# Patient Record
Sex: Female | Born: 1955 | Race: Black or African American | Hispanic: No | Smoking: Current some day smoker
Health system: Southern US, Community
[De-identification: ages and names within clinical notes are randomized; demographics above are authoritative.]

## PROBLEM LIST (undated history)

## (undated) DIAGNOSIS — E119 Type 2 diabetes mellitus without complications: Secondary | ICD-10-CM

## (undated) DIAGNOSIS — I10 Essential (primary) hypertension: Secondary | ICD-10-CM

---

## 1997-08-03 ENCOUNTER — Encounter: Admission: RE | Admit: 1997-08-03 | Discharge: 1997-08-03 | Payer: Self-pay | Admitting: Family Medicine

## 1997-08-14 ENCOUNTER — Encounter: Admission: RE | Admit: 1997-08-14 | Discharge: 1997-08-14 | Payer: Self-pay | Admitting: Family Medicine

## 1997-08-28 ENCOUNTER — Encounter: Admission: RE | Admit: 1997-08-28 | Discharge: 1997-08-28 | Payer: Self-pay | Admitting: Family Medicine

## 1997-09-20 ENCOUNTER — Encounter: Admission: RE | Admit: 1997-09-20 | Discharge: 1997-09-20 | Payer: Self-pay | Admitting: Family Medicine

## 1997-10-20 ENCOUNTER — Encounter: Admission: RE | Admit: 1997-10-20 | Discharge: 1997-10-20 | Payer: Self-pay | Admitting: Family Medicine

## 1997-11-02 ENCOUNTER — Other Ambulatory Visit: Admission: RE | Admit: 1997-11-02 | Discharge: 1997-11-02 | Payer: Self-pay | Admitting: *Deleted

## 1997-12-23 ENCOUNTER — Emergency Department (HOSPITAL_COMMUNITY): Admission: EM | Admit: 1997-12-23 | Discharge: 1997-12-23 | Payer: Self-pay | Admitting: Emergency Medicine

## 1998-01-24 ENCOUNTER — Encounter: Admission: RE | Admit: 1998-01-24 | Discharge: 1998-01-24 | Payer: Self-pay | Admitting: Family Medicine

## 1998-03-01 ENCOUNTER — Encounter: Admission: RE | Admit: 1998-03-01 | Discharge: 1998-03-01 | Payer: Self-pay | Admitting: Family Medicine

## 1998-03-27 ENCOUNTER — Encounter: Admission: RE | Admit: 1998-03-27 | Discharge: 1998-03-27 | Payer: Self-pay | Admitting: Family Medicine

## 1998-08-27 ENCOUNTER — Encounter: Admission: RE | Admit: 1998-08-27 | Discharge: 1998-08-27 | Payer: Self-pay | Admitting: Family Medicine

## 1998-09-05 ENCOUNTER — Encounter: Admission: RE | Admit: 1998-09-05 | Discharge: 1998-09-05 | Payer: Self-pay | Admitting: Family Medicine

## 1998-09-20 ENCOUNTER — Encounter: Admission: RE | Admit: 1998-09-20 | Discharge: 1998-09-20 | Payer: Self-pay | Admitting: Family Medicine

## 1998-12-03 ENCOUNTER — Encounter: Admission: RE | Admit: 1998-12-03 | Discharge: 1998-12-03 | Payer: Self-pay | Admitting: Family Medicine

## 2005-07-28 ENCOUNTER — Ambulatory Visit: Payer: Self-pay | Admitting: Internal Medicine

## 2008-11-23 ENCOUNTER — Emergency Department (HOSPITAL_COMMUNITY): Admission: EM | Admit: 2008-11-23 | Discharge: 2008-11-23 | Payer: Self-pay | Admitting: Emergency Medicine

## 2008-12-19 ENCOUNTER — Emergency Department (HOSPITAL_COMMUNITY): Admission: EM | Admit: 2008-12-19 | Discharge: 2008-12-19 | Payer: Self-pay | Admitting: Family Medicine

## 2009-02-07 ENCOUNTER — Emergency Department (HOSPITAL_COMMUNITY): Admission: EM | Admit: 2009-02-07 | Discharge: 2009-02-07 | Payer: Self-pay | Admitting: Family Medicine

## 2011-03-25 ENCOUNTER — Ambulatory Visit (HOSPITAL_COMMUNITY): Admit: 2011-03-25 | Payer: Self-pay | Admitting: Cardiology

## 2011-03-25 ENCOUNTER — Encounter (HOSPITAL_COMMUNITY): Payer: Self-pay

## 2011-03-25 SURGERY — LEFT HEART CATHETERIZATION WITH CORONARY ANGIOGRAM
Anesthesia: LOCAL

## 2016-11-29 ENCOUNTER — Emergency Department (HOSPITAL_COMMUNITY)
Admission: EM | Admit: 2016-11-29 | Discharge: 2016-11-29 | Disposition: A | Discharge status: Home / Self Care | Payer: Medicaid - Out of State | Attending: Emergency Medicine | Admitting: Emergency Medicine

## 2016-11-29 ENCOUNTER — Emergency Department (HOSPITAL_COMMUNITY): Payer: Medicaid - Out of State

## 2016-11-29 ENCOUNTER — Encounter (HOSPITAL_COMMUNITY): Payer: Self-pay | Admitting: Emergency Medicine

## 2016-11-29 DIAGNOSIS — M791 Myalgia: Secondary | ICD-10-CM | POA: Insufficient documentation

## 2016-11-29 DIAGNOSIS — Z79899 Other long term (current) drug therapy: Secondary | ICD-10-CM | POA: Diagnosis not present

## 2016-11-29 DIAGNOSIS — M79601 Pain in right arm: Secondary | ICD-10-CM | POA: Insufficient documentation

## 2016-11-29 DIAGNOSIS — F17210 Nicotine dependence, cigarettes, uncomplicated: Secondary | ICD-10-CM | POA: Insufficient documentation

## 2016-11-29 DIAGNOSIS — E119 Type 2 diabetes mellitus without complications: Secondary | ICD-10-CM | POA: Diagnosis not present

## 2016-11-29 DIAGNOSIS — Z7982 Long term (current) use of aspirin: Secondary | ICD-10-CM | POA: Diagnosis not present

## 2016-11-29 DIAGNOSIS — I10 Essential (primary) hypertension: Secondary | ICD-10-CM | POA: Insufficient documentation

## 2016-11-29 DIAGNOSIS — R0789 Other chest pain: Secondary | ICD-10-CM | POA: Diagnosis not present

## 2016-11-29 DIAGNOSIS — Z7984 Long term (current) use of oral hypoglycemic drugs: Secondary | ICD-10-CM | POA: Diagnosis not present

## 2016-11-29 DIAGNOSIS — M542 Cervicalgia: Secondary | ICD-10-CM | POA: Diagnosis not present

## 2016-11-29 HISTORY — DX: Essential (primary) hypertension: I10

## 2016-11-29 HISTORY — DX: Type 2 diabetes mellitus without complications: E11.9

## 2016-11-29 LAB — CBC WITH DIFFERENTIAL/PLATELET
Basophils Absolute: 0 10*3/uL (ref 0.0–0.1)
Basophils Relative: 0 %
Eosinophils Absolute: 0.2 10*3/uL (ref 0.0–0.7)
Eosinophils Relative: 3 %
HCT: 36.4 % (ref 36.0–46.0)
Hemoglobin: 12.3 g/dL (ref 12.0–15.0)
Lymphocytes Relative: 38 %
Lymphs Abs: 2 10*3/uL (ref 0.7–4.0)
MCH: 31.1 pg (ref 26.0–34.0)
MCHC: 33.8 g/dL (ref 30.0–36.0)
MCV: 92.2 fL (ref 78.0–100.0)
Monocytes Absolute: 0.4 10*3/uL (ref 0.1–1.0)
Monocytes Relative: 8 %
Neutro Abs: 2.6 10*3/uL (ref 1.7–7.7)
Neutrophils Relative %: 49 %
Platelets: 317 10*3/uL (ref 150–400)
RBC: 3.95 MIL/uL (ref 3.87–5.11)
RDW: 14.4 % (ref 11.5–15.5)
WBC: 5.3 10*3/uL (ref 4.0–10.5)

## 2016-11-29 LAB — BASIC METABOLIC PANEL
Anion gap: 7 (ref 5–15)
BUN: 11 mg/dL (ref 6–20)
CO2: 28 mmol/L (ref 22–32)
Calcium: 9 mg/dL (ref 8.9–10.3)
Chloride: 106 mmol/L (ref 101–111)
Creatinine, Ser: 0.66 mg/dL (ref 0.44–1.00)
GFR calc Af Amer: >60 mL/min (ref >60)
GFR calc non Af Amer: >60 mL/min (ref >60)
Glucose, Bld: 106 mg/dL — ABNORMAL HIGH (ref 65–99)
Potassium: 4.9 mmol/L (ref 3.5–5.1)
Sodium: 141 mmol/L (ref 135–145)

## 2016-11-29 LAB — I-STAT TROPONIN, ED: Troponin i, poc: 0 ng/mL (ref 0.00–0.08)

## 2016-11-29 MED ORDER — KETOROLAC TROMETHAMINE 15 MG/ML IJ SOLN
15.0000 mg | Freq: Once | INTRAMUSCULAR | Status: AC
  Administered 2016-11-29: 15 mg via INTRAVENOUS
  Filled 2016-11-29: qty 1

## 2016-11-29 MED ORDER — NAPROXEN 375 MG PO TABS: 375.0000 mg | ORAL_TABLET | Freq: Two times a day (BID) | ORAL | 0 refills | Status: AC

## 2016-11-29 MED ORDER — CYCLOBENZAPRINE HCL 10 MG PO TABS: 10.0000 mg | ORAL_TABLET | Freq: Two times a day (BID) | ORAL | 0 refills | Status: AC | PRN

## 2016-11-29 MED ORDER — ACETAMINOPHEN 500 MG PO TABS: 500.0000 mg | ORAL_TABLET | Freq: Four times a day (QID) | ORAL | 0 refills | Status: AC | PRN

## 2016-11-29 NOTE — ED Notes (Signed)
EKG performed in triage. [employer]

## 2016-11-29 NOTE — Discharge Instructions (Signed)
Medications: Naprosyn, Tylenol, Flexeril  Treatment: Take Naprosyn twice daily as prescribed for your pain. Take Tylenol 6 hours alternating with Naprosyn. Take Flexeril twice daily as needed for muscle pain and spasms. Use ice to your chest and neck 3-4 times daily alternating 15 minutes on, 15 minutes off.  Follow-up: Please follow-up with your doctor upon returning to [facility] your symptoms are not improved. Please return to the emergency department if you develop any new or worsening symptoms.

## 2016-11-29 NOTE — ED Triage Notes (Signed)
Patient here from home with complaints of chest pain radiating up into neck that started 2 days. Chest pain described as shooting  Denies n/v/d.

## 2016-11-29 NOTE — ED Provider Notes (Signed)
[facility]-EMERGENCY DEPT Provider Note   CSN: [id number] Arrival date & time: 11/29/16  1113     History   Chief Complaint Chief Complaint  Patient presents with  . Neck Pain  . Chest Pain    HPI Lori Burgess is a 61 y.o. female with history of hypertension, diabetes who presents with a 2 day history of right-sided chest pain. Her pain is sharp and constant. Patient reports her pain radiates to her neck and right arm. It is worse with movements and certain positions. Patient denies any injury. Patient does note that she occasionally carries her purse on her right shoulder. She denies shortness of breath, abdominal pain, nausea, vomiting, urinary symptoms. She has not taken any medications for her symptoms. She denies any recent long trips, surgeries, cancer, history of blood clots, new leg pain or swelling, exogenous estrogen use. Patient lives in [place] and is visiting her family member. She has been here for 2 weeks and plans to return home next week.  HPI  Past Medical History:  Diagnosis Date  . Diabetes mellitus without complication (HCC)   . Hypertension     There are no active problems to display for this patient.   No past surgical history on file.  OB History    No data available       Home Medications    Prior to Admission medications   Medication Sig Start Date End Date Taking? Authorizing Provider  aspirin 325 MG EC tablet Take 325 mg by mouth daily.   Yes [provider]  atorvastatin (LIPITOR) 40 MG tablet Take 40 mg by mouth at bedtime. 02/18/16  Yes [provider]  butalbital-acetaminophen-caffeine (FIORICET, ESGIC) 50-325-40 MG tablet Take 1 tablet by mouth 4 (four) times daily. 11/11/16  Yes [provider]  citalopram (CELEXA) 20 MG tablet Take 20 mg by mouth daily. 11/11/16  Yes [provider]  gabapentin (NEURONTIN) 300 MG capsule Take 300-600 mg by mouth 2 (two) times daily.   Yes [provider]  JANUVIA 50 MG tablet Take 50 mg by mouth daily. 11/11/16  Yes [provider]  lactulose (CHRONULAC) 10 GM/15ML solution Take 10 mLs by mouth daily. 11/11/16  Yes [provider]  losartan (COZAAR) 50 MG tablet Take 50 mg by mouth daily. 10/14/16  Yes [provider]  montelukast (SINGULAIR) 10 MG tablet Take 10 mg by mouth at bedtime. 10/14/16  Yes [provider]  acetaminophen (TYLENOL) 500 MG tablet Take 1 tablet (500 mg total) by mouth every 6 (six) hours as needed. 11/29/16   Clova Morlock, Waylan Boga, PA-C  cyclobenzaprine (FLEXERIL) 10 MG tablet Take 1 tablet (10 mg total) by mouth 2 (two) times daily as needed for muscle spasms. 11/29/16   Carma Dwiggins, Waylan Boga, PA-C  naproxen (NAPROSYN) 375 MG tablet Take 1 tablet (375 mg total) by mouth 2 (two) times daily. 11/29/16   Emi Holes, PA-C    Family History No family history on file.  Social History Social History  Substance Use Topics  . Smoking status: Current Some Day Smoker  . Smokeless tobacco: Never Used  . Alcohol use Not on file     Allergies   Patient has no known allergies.   Review of Systems Review of Systems  Constitutional: Negative for chills and fever.  HENT: Negative for facial swelling and sore throat.   Respiratory: Negative for shortness of breath.   Cardiovascular: Positive for chest pain.  Gastrointestinal: Negative for abdominal pain,  nausea and vomiting.  Genitourinary: Negative for dysuria.  Musculoskeletal: Positive for myalgias and neck pain. Negative for back pain.  Skin: Negative for rash and wound.  Neurological: Negative for headaches.  Psychiatric/Behavioral: The patient is not nervous/anxious.      Physical Exam Updated Vital Signs BP (!) 157/67   Pulse (!) 52   Temp 98.2 F (36.8 C)   Resp 18   SpO2 98%   Physical Exam  Constitutional: She appears well-developed and well-nourished. No distress.  HENT:  Head: Normocephalic and atraumatic.    Mouth/Throat: Oropharynx is clear and moist. No oropharyngeal exudate.  Eyes: Pupils are equal, round, and reactive to light. Conjunctivae are normal. Right eye exhibits no discharge. Left eye exhibits no discharge. No scleral icterus.  Neck: Normal range of motion. Neck supple. No thyromegaly present.    No anterior tenderness or masses  Cardiovascular: Regular rhythm, normal heart sounds and intact distal pulses.  Exam reveals no gallop and no friction rub.   No murmur heard. Pulmonary/Chest: Effort normal and breath sounds normal. No stridor. No respiratory distress. She has no wheezes. She has no rales. She exhibits tenderness.    Abdominal: Soft. Bowel sounds are normal. She exhibits no distension. There is no tenderness. There is no rebound and no guarding.  Musculoskeletal: She exhibits no edema.       Arms: Lymphadenopathy:    She has no cervical adenopathy.  Neurological: She is alert. Coordination normal.  Skin: Skin is warm and dry. No rash noted. She is not diaphoretic. No pallor.  Psychiatric: She has a normal mood and affect.  Nursing note and vitals reviewed.    ED Treatments / Results  Labs (all labs ordered are listed, but only abnormal results are displayed) Labs Reviewed  BASIC METABOLIC PANEL - Abnormal; Notable for the following:       Result Value   Glucose, Bld 106 (*)    All other components within normal limits  CBC WITH DIFFERENTIAL/PLATELET  I-STAT TROPONIN, ED    EKG  EKG Interpretation  Date/Time:  Saturday November 29 2016 12:11:07 EDT Ventricular Rate:  64 PR Interval:    QRS Duration: 96 QT Interval:  401 QTC Calculation: 414 R Axis:   60 Text Interpretation:  Sinus rhythm No old tracing to compare Confirmed by Linwood Dibbles [id number]) on 11/29/2016 3:39:33 PM       Radiology Dg Chest 2 View  Result Date: 11/29/2016 CLINICAL DATA:  Right-sided chest pain EXAM: CHEST  2 VIEW COMPARISON:  None. FINDINGS: Cardiomegaly with left ventricular  prominence. Negative aortic and hilar contours. There is no edema, consolidation, effusion, or pneumothorax. No acute osseous finding. IMPRESSION: 1. No acute finding. 2. Large appearance of the left ventricle. Electronically Signed   By: Marnee Spring M.D.   On: 11/29/2016 15:46    Procedures Procedures (including critical care time)  Medications Ordered in ED Medications  ketorolac (TORADOL) 15 MG/ML injection 15 mg (15 mg Intravenous Given 11/29/16 1627)     Initial Impression / Assessment and Plan / ED Course  I have reviewed the triage vital signs and the nursing notes.  Pertinent labs & imaging results that were available during my care of the patient were reviewed by me and considered in my medical decision making (see chart for details).     Patient with right-sided chest pain radiating to base of neck and upper right arm. Labs unremarkable. EKG shows NSR. Chest x-ray negative. Pain is reproducible. Suspect musculoskeletal cause. Doubt ACS  or PE. Symptoms much improved with Toradol in the ED. We'll discharge patient home with Naprosyn, Tylenol, Flexeril, supportive treatment including ice and stretching with follow-up to PCP upon returning home to [facility]. Strict return precautions given. Patient understands and agrees with plan. Patient vitals stable throughout ED course and discharged in satisfactory condition.  Final Clinical Impressions(s) / ED Diagnoses   Final diagnoses:  Chest wall pain    New Prescriptions Discharge Medication List as of 11/29/2016  5:05 PM    START taking these medications   Details  acetaminophen (TYLENOL) 500 MG tablet Take 1 tablet (500 mg total) by mouth every 6 (six) hours as needed., Starting Sat 11/29/2016, Print    cyclobenzaprine (FLEXERIL) 10 MG tablet Take 1 tablet (10 mg total) by mouth 2 (two) times daily as needed for muscle spasms., Starting Sat 11/29/2016, Print    naproxen (NAPROSYN) 375 MG tablet Take 1 tablet (375 mg total) by mouth 2  (two) times daily., Starting Sat 11/29/2016, Print         Alvetta Hidrogo, [place], PA-C 11/29/16 2018    Alvira Monday, MD 12/01/16 1239

## 2016-12-04 MED FILL — ?CYCLOBENZAPRINE 10 MG TABL: 10 | 10 days supply | Qty: 20 | Fill #0

## 2018-02-06 IMAGING — CR DG CHEST 2V
2 series · 2 of 2 positions shown · non-contrast
Comparison: None.

CLINICAL DATA: Right-sided chest pain

EXAM:
CHEST  2 VIEW

[w chest pa]
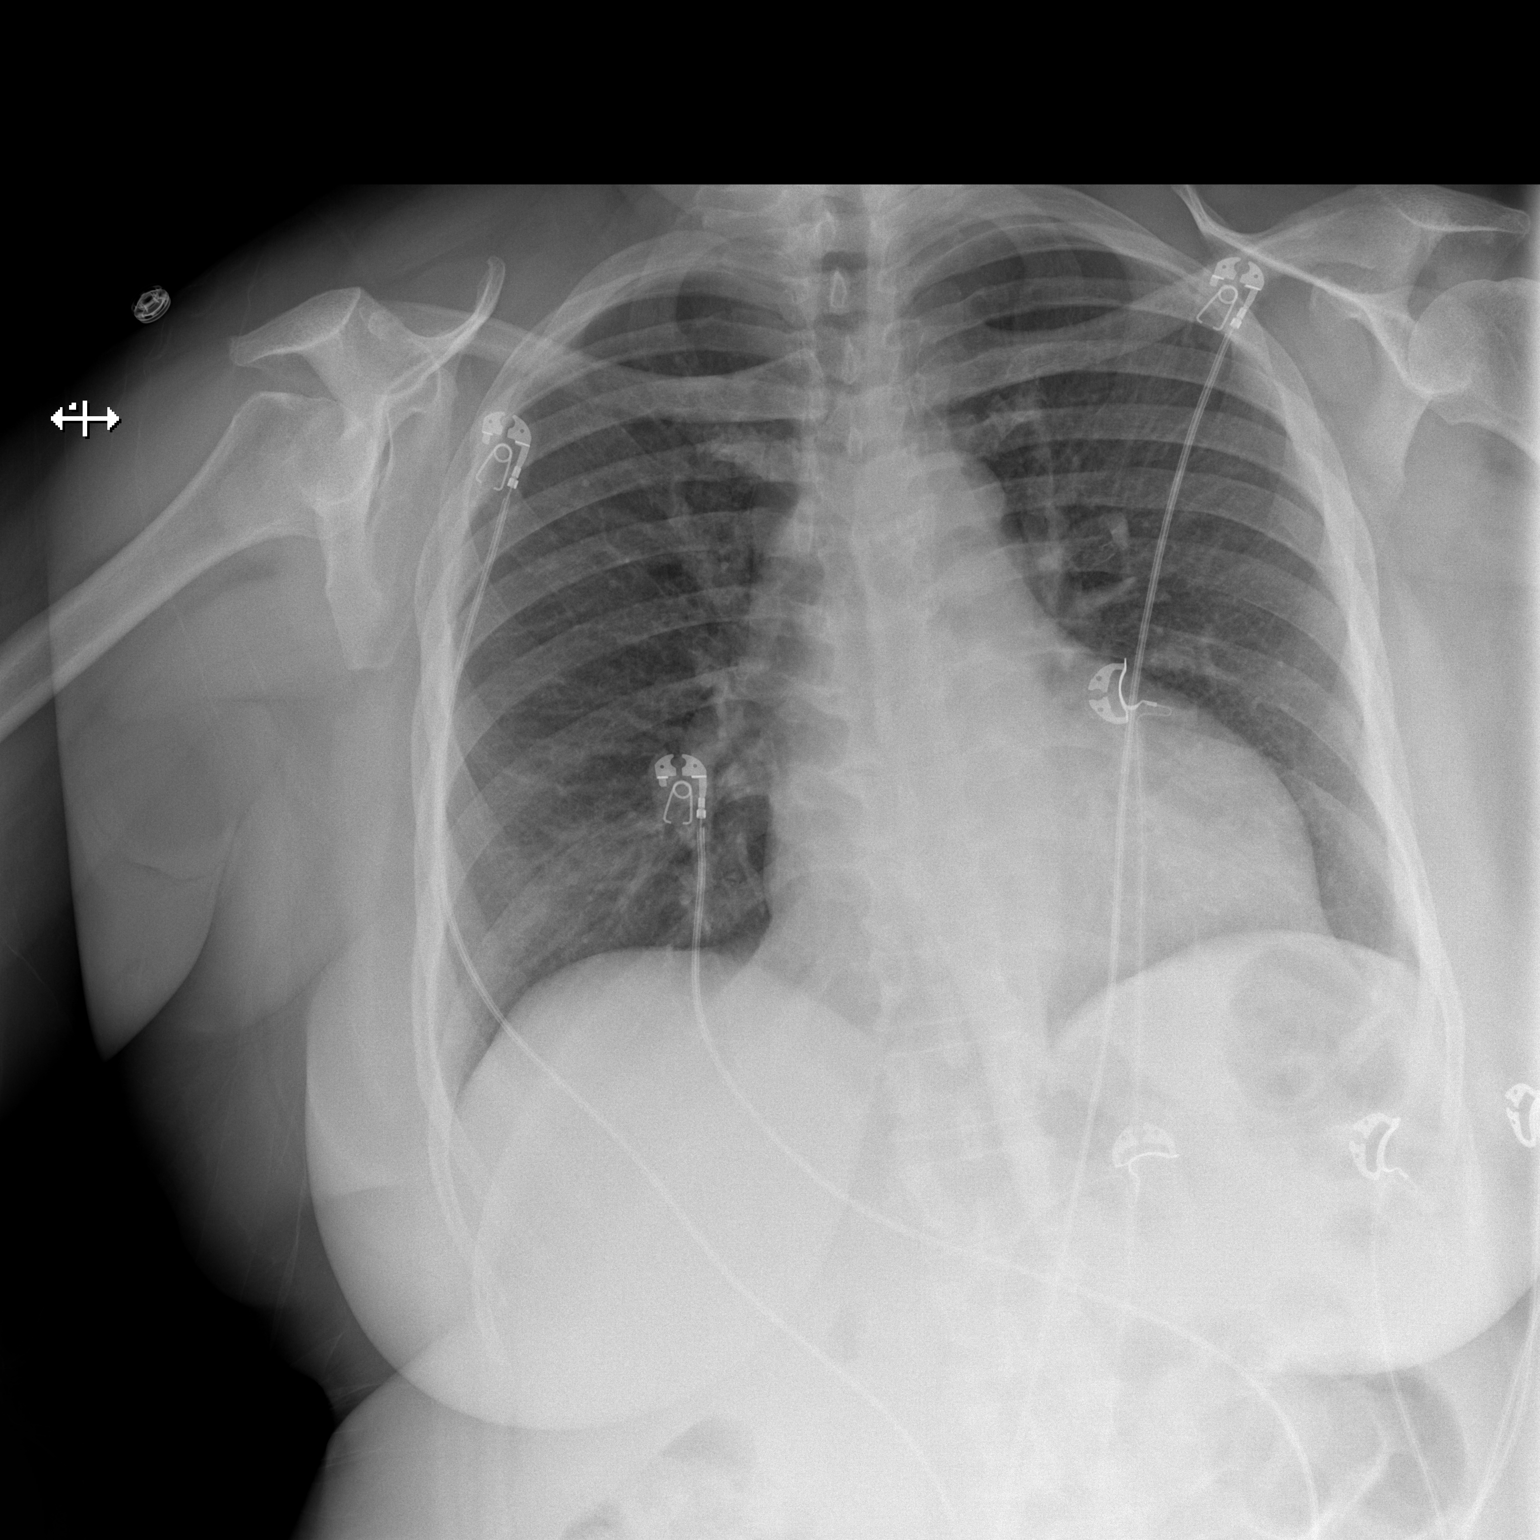

[w chest lat]
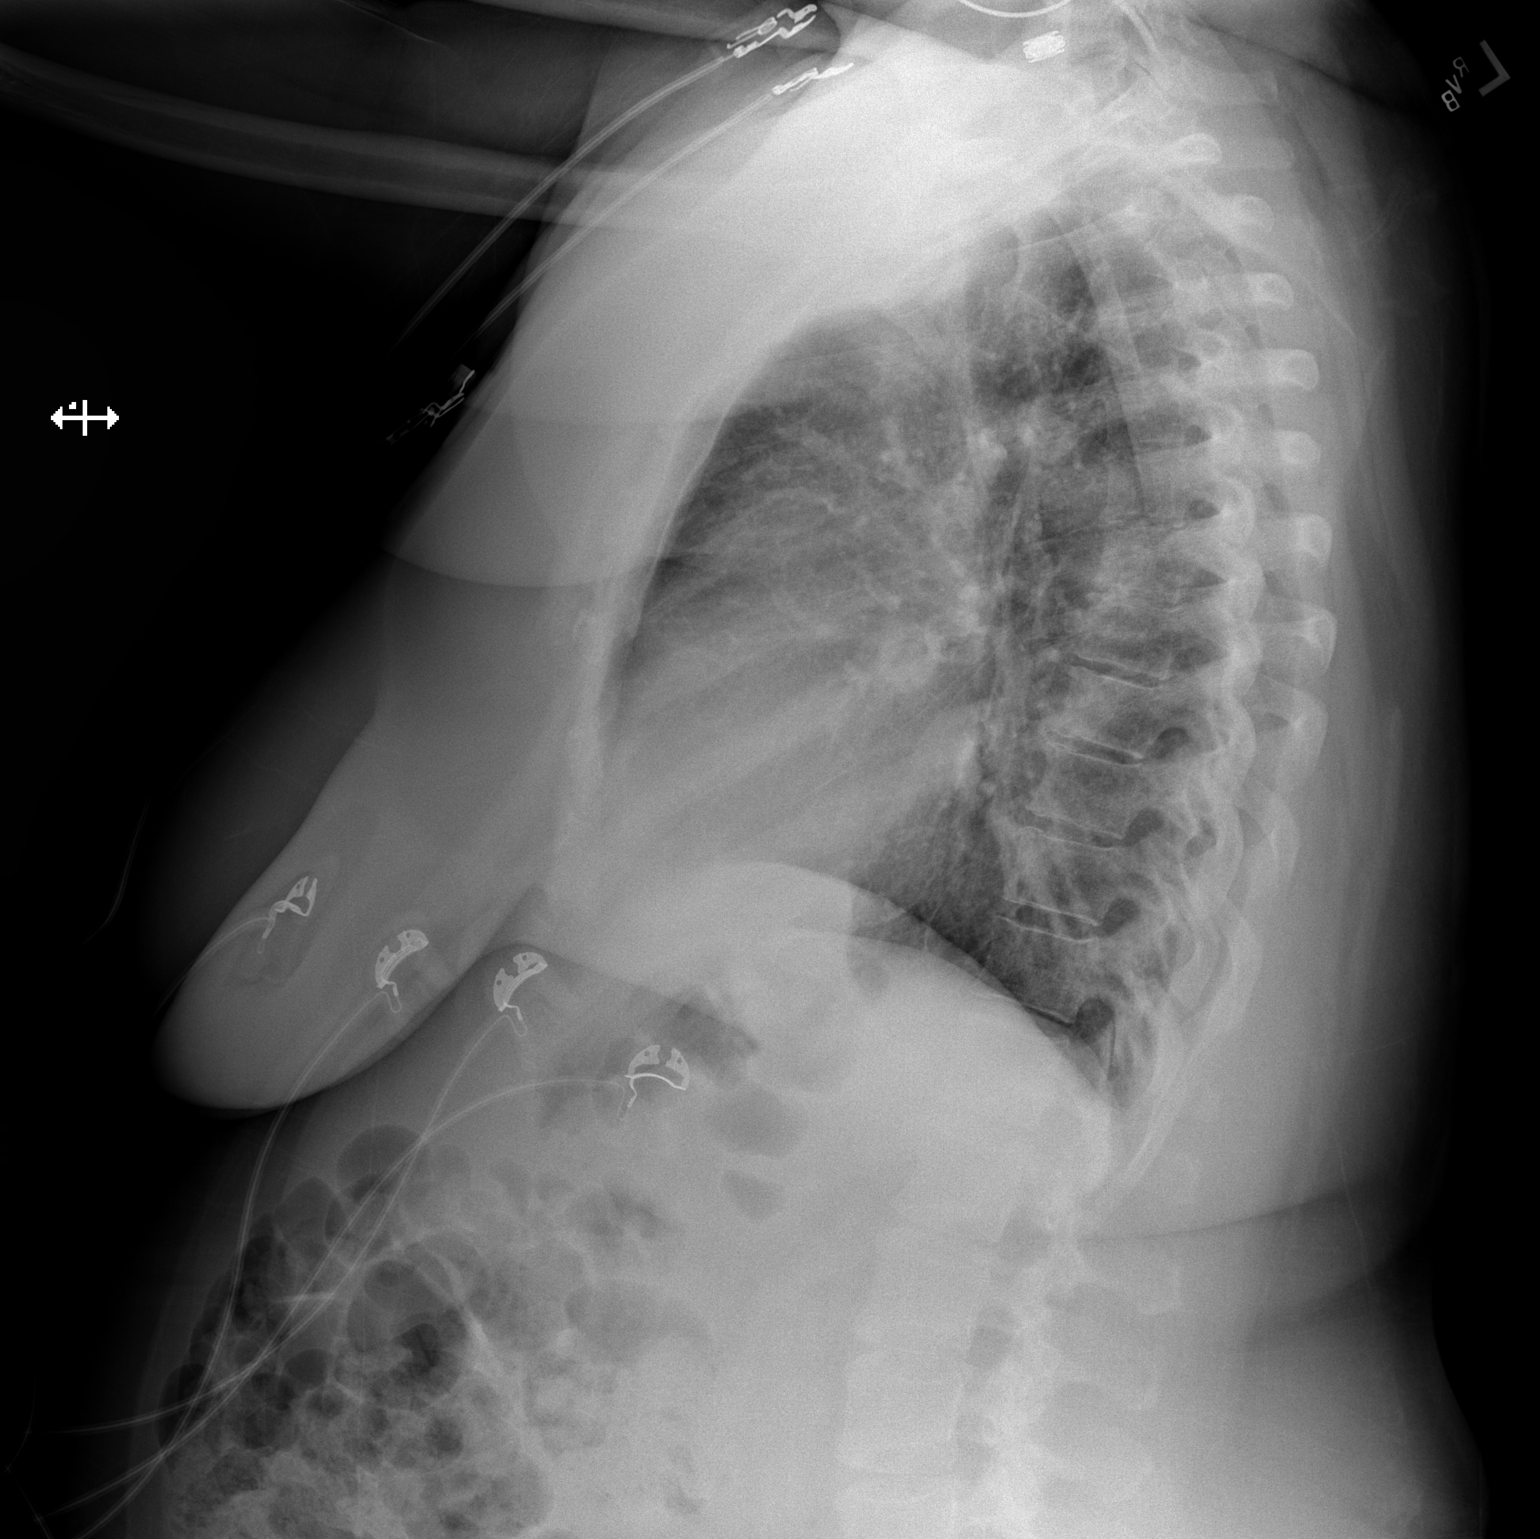

[2 of 2 positions shown; findings below may reference images not displayed]

FINDINGS: Cardiomegaly with left ventricular prominence. Negative aortic and
hilar contours. There is no edema, consolidation, effusion, or
pneumothorax. No acute osseous finding.
IMPRESSION: 1. No acute finding.
2. Large appearance of the left ventricle.
